# Patient Record
Sex: Male | Born: 1985 | Hispanic: No | Marital: Single | Smoking: Never smoker
Health system: Southern US, Community
[De-identification: ages and names within clinical notes are randomized; demographics above are authoritative.]

---

## 2015-12-24 ENCOUNTER — Emergency Department (HOSPITAL_COMMUNITY)
Admission: EM | Admit: 2015-12-24 | Discharge: 2015-12-24 | Disposition: A | Discharge status: Home / Self Care | Payer: Self-pay | Attending: Emergency Medicine | Admitting: Emergency Medicine

## 2015-12-24 ENCOUNTER — Encounter (HOSPITAL_COMMUNITY): Payer: Self-pay | Admitting: Emergency Medicine

## 2015-12-24 ENCOUNTER — Emergency Department (HOSPITAL_COMMUNITY): Payer: Self-pay

## 2015-12-24 DIAGNOSIS — R0789 Other chest pain: Secondary | ICD-10-CM | POA: Insufficient documentation

## 2015-12-24 LAB — COMPREHENSIVE METABOLIC PANEL
ALBUMIN: 4.2 g/dL (ref 3.5–5.0)
ALK PHOS: 69 U/L (ref 38–126)
ALT: 41 U/L (ref 17–63)
AST: 27 U/L (ref 15–41)
Anion gap: 8 (ref 5–15)
BUN: 7 mg/dL (ref 6–20)
CALCIUM: 9.3 mg/dL (ref 8.9–10.3)
CO2: 27 mmol/L (ref 22–32)
CREATININE: 0.88 mg/dL (ref 0.61–1.24)
Chloride: 101 mmol/L (ref 101–111)
GFR calc Af Amer: >60 mL/min (ref >60)
GFR calc non Af Amer: >60 mL/min (ref >60)
GLUCOSE: 118 mg/dL — AB (ref 65–99)
Potassium: 3.6 mmol/L (ref 3.5–5.1)
SODIUM: 136 mmol/L (ref 135–145)
TOTAL PROTEIN: 7 g/dL (ref 6.5–8.1)
Total Bilirubin: 0.4 mg/dL (ref 0.3–1.2)

## 2015-12-24 LAB — CBC WITH DIFFERENTIAL/PLATELET
Basophils Absolute: 0 10*3/uL (ref 0.0–0.1)
Basophils Relative: 0 %
EOS ABS: 0.1 10*3/uL (ref 0.0–0.7)
Eosinophils Relative: 1 %
HCT: 43.4 % (ref 39.0–52.0)
HEMOGLOBIN: 15.1 g/dL (ref 13.0–17.0)
LYMPHS ABS: 2.9 10*3/uL (ref 0.7–4.0)
Lymphocytes Relative: 33 %
MCH: 28.2 pg (ref 26.0–34.0)
MCHC: 34.8 g/dL (ref 30.0–36.0)
MCV: 81.1 fL (ref 78.0–100.0)
Monocytes Absolute: 0.4 10*3/uL (ref 0.1–1.0)
Monocytes Relative: 4 %
NEUTROS PCT: 62 %
Neutro Abs: 5.4 10*3/uL (ref 1.7–7.7)
Platelets: 234 10*3/uL (ref 150–400)
RBC: 5.35 MIL/uL (ref 4.22–5.81)
RDW: 12.3 % (ref 11.5–15.5)
WBC: 8.8 10*3/uL (ref 4.0–10.5)

## 2015-12-24 LAB — I-STAT TROPONIN, ED
TROPONIN I, POC: 0 ng/mL (ref 0.00–0.08)
TROPONIN I, POC: 0 ng/mL (ref 0.00–0.08)

## 2015-12-24 MED ORDER — NAPROXEN 250 MG PO TABS
500.0000 mg | ORAL_TABLET | Freq: Once | ORAL | Status: AC
  Administered 2015-12-24: 500 mg via ORAL
  Filled 2015-12-24: qty 2

## 2015-12-24 MED ORDER — METHOCARBAMOL 500 MG PO TABS
500.0000 mg | ORAL_TABLET | Freq: Once | ORAL | Status: AC
  Administered 2015-12-24: 500 mg via ORAL
  Filled 2015-12-24: qty 1

## 2015-12-24 MED ORDER — NAPROXEN 500 MG PO TABS: 500.0000 mg | ORAL_TABLET | Freq: Two times a day (BID) | ORAL | 0 refills | Status: AC

## 2015-12-24 MED ORDER — METHOCARBAMOL 500 MG PO TABS: 500.0000 mg | ORAL_TABLET | Freq: Two times a day (BID) | ORAL | 0 refills | Status: AC

## 2015-12-24 NOTE — Discharge Instructions (Signed)
We saw you in the ER for the chest pain/shortness of breath. ?All of our cardiac workup is normal, including labs, EKG and chest X-RAY are normal. ?We are not sure what is causing your discomfort, but we feel comfortable sending you home at this time. The workup in the ER is not complete, and you should follow up with your primary care doctor for further evaluation. ? ?Please return to the ER if you have worsening chest pain, shortness of breath, pain radiating to your jaw, shoulder, or back, sweats or fainting.  ?

## 2015-12-24 NOTE — ED Triage Notes (Signed)
Patient reports intermittent left chest pain onset 3 days ago with mild SOB and emesis yesterday .

## 2015-12-24 NOTE — ED Provider Notes (Addendum)
[facility]-EMERGENCY DEPT Provider Note   CSN: [id number] Arrival date & time: 12/24/15  [id number]  By signing my name below, I, [name], attest that this documentation has been prepared under the direction and in the presence of Derwood KaplanAnkit Marks Scalera, MD. Electronically Signed: [name], ED Scribe. 12/24/15. 3:11 AM.  History   Chief Complaint Chief Complaint  Patient presents with  . Chest Pain     The history is provided by the patient. No language interpreter was used.     HPI Comments: Robert Ponce is a 30 y.o. male who presents to the Emergency Department complaining of intermittent stabbing pains of the left lower chest which began 3 days ago. Pain worsens at night and when working; states he walks a lot at work. The pain comes hourly and lasts for a few seconds and resolves on its own. Pt associated symptoms include fatigue and occasional lightheadedness. Pt is a smoker and states he smoke 10 cigarettes a day. He is also an occasional drinker. No illicit drug use. No modifying factors noted. Pt denies SOB, nausea, vomiting, and cough. He also denies h/o diabetes, HLD, PE/DVT, long distance traveling and recent surgeries. Pt has a FHx of cardiac issues.     History reviewed. No pertinent past medical history.  There are no active problems to display for this patient.   History reviewed. No pertinent surgical history.     Home Medications    Prior to Admission medications   Medication Sig Start Date End Date Taking? Authorizing Provider  methocarbamol (ROBAXIN) 500 MG tablet Take 1 tablet (500 mg total) by mouth 2 (two) times daily. 12/24/15   Derwood KaplanAnkit Kenderick Kobler, MD  naproxen (NAPROSYN) 500 MG tablet Take 1 tablet (500 mg total) by mouth 2 (two) times daily. 12/24/15   Derwood KaplanAnkit Dinita Migliaccio, MD    Family History No family history on file.  Social History Social History  Substance Use Topics  . Smoking status: Never Smoker  . Smokeless tobacco: Never Used  . Alcohol use No      Allergies   Patient has no known allergies.   Review of Systems Review of Systems  10 systems reviewed and all are negative for acute change except as noted in the HPI.   Physical Exam Updated Vital Signs BP 104/74 (BP Location: Right Arm)   Pulse 74   Temp 98.1 F (36.7 C) (Oral)   Resp 22   Ht 5\' 9"  (1.753 m)   Wt 215 lb (97.5 kg)   SpO2 99%   BMI 31.75 kg/m   Physical Exam  Constitutional: He appears well-developed and well-nourished.  HENT:  Head: Normocephalic and atraumatic.  Eyes: Conjunctivae are normal.  Neck: Neck supple.  No radiation to jaw and shoulders  Cardiovascular: Normal rate and regular rhythm.   No murmur heard. Distal pulses are equal  Pulmonary/Chest: Effort normal and breath sounds normal. No respiratory distress. He exhibits no tenderness.  Lungs are clear to ascultation, No reproducible chest tenderness  Abdominal: Soft. There is no tenderness.  Abdomen is soft  Musculoskeletal: He exhibits no edema.  No pedal edema No unilateral leg swelling  Neurological: He is alert.  Skin: Skin is warm and dry.  Psychiatric: He has a normal mood and affect.  Nursing note and vitals reviewed.    ED Treatments / Results  DIAGNOSTIC STUDIES: Oxygen Saturation is 100% on RA, normal by my interpretation.    COORDINATION OF CARE: 2:25 AM Discussed treatment plan with pt at bedside and pt agreed  to plan.  Labs (all labs ordered are listed, but only abnormal results are displayed) Labs Reviewed  COMPREHENSIVE METABOLIC PANEL - Abnormal; Notable for the following:       Result Value   Glucose, Bld 118 (*)    All other components within normal limits  CBC WITH DIFFERENTIAL/PLATELET  Rosezena SensorI-STAT TROPOININ, ED  Rosezena SensorI-STAT TROPOININ, ED    EKG  EKG Interpretation  Date/Time:  Saturday December 24 2015 01:08:20 EST Ventricular Rate:  92 PR Interval:  154 QRS Duration: 92 QT Interval:  324 QTC Calculation: 400 R Axis:   58 Text Interpretation:   Normal sinus rhythm Normal ECG No acute changes No significant change since last tracing Confirmed by Rhunette CroftNANAVATI, MD, Janey GentaANKIT [phone]) on 12/24/2015 1:24:57 AM       Radiology Dg Chest 2 View  Result Date: 12/24/2015 CLINICAL DATA:  Left-sided chest pain and dyspnea for 3 days EXAM: CHEST  2 VIEW COMPARISON:  None. FINDINGS: The lungs are clear. The pulmonary vasculature is normal. Heart size is normal. Hilar and mediastinal contours are unremarkable. There is no pleural effusion. IMPRESSION: No active cardiopulmonary disease. Electronically Signed   By: Ellery Plunkaniel R Mitchell M.D.   On: 12/24/2015 01:35    Procedures Procedures (including critical care time)  Medications Ordered in ED Medications  naproxen (NAPROSYN) tablet 500 mg (500 mg Oral Given 12/24/15 0249)  methocarbamol (ROBAXIN) tablet 500 mg (500 mg Oral Given 12/24/15 0248)     Initial Impression / Assessment and Plan / ED Course  I have reviewed the triage vital signs and the nursing notes.  Pertinent labs & imaging results that were available during my care of the patient were reviewed by me and considered in my medical decision making (see chart for details).  Clinical Course as of Dec 24 502  Sat Dec 24, 2015  0451 Trops neg. We will d/c. PCP f/u advised.   [AN]    Clinical Course User Index [AN] Derwood KaplanAnkit Jessika Rothery, MD   Pt comes in with cc of chest pain. Differential diagnosis includes: ACS syndrome Myocarditis Pericarditis Musculoskeletal pain  Pt is a poor historian. Hx is not very clear as to what the etiology is, but on exam it appears more musculoskeletal. HEAR score is 1 - for smoking. EKg is reassuring. We will get trops x 2.   Final Clinical Impressions(s) / ED Diagnoses   Final diagnoses:  Atypical chest pain    New Prescriptions New Prescriptions   METHOCARBAMOL (ROBAXIN) 500 MG TABLET    Take 1 tablet (500 mg total) by mouth 2 (two) times daily.   NAPROXEN (NAPROSYN) 500 MG TABLET    Take 1  tablet (500 mg total) by mouth 2 (two) times daily.   I personally performed the services described in this documentation, which was scribed in my presence. The recorded information has been reviewed and is accurate.     Derwood KaplanAnkit Tijuana Scheidegger, MD 12/24/15 [id number]    Derwood KaplanAnkit Alaya Iverson, MD 12/24/15 [id number]

## 2017-12-16 IMAGING — CR DG CHEST 2V
2 series · 2 of 2 positions shown · non-contrast
Comparison: None.

CLINICAL DATA: Left-sided chest pain and dyspnea for 3 days

EXAM:
CHEST  2 VIEW

[chest pa]
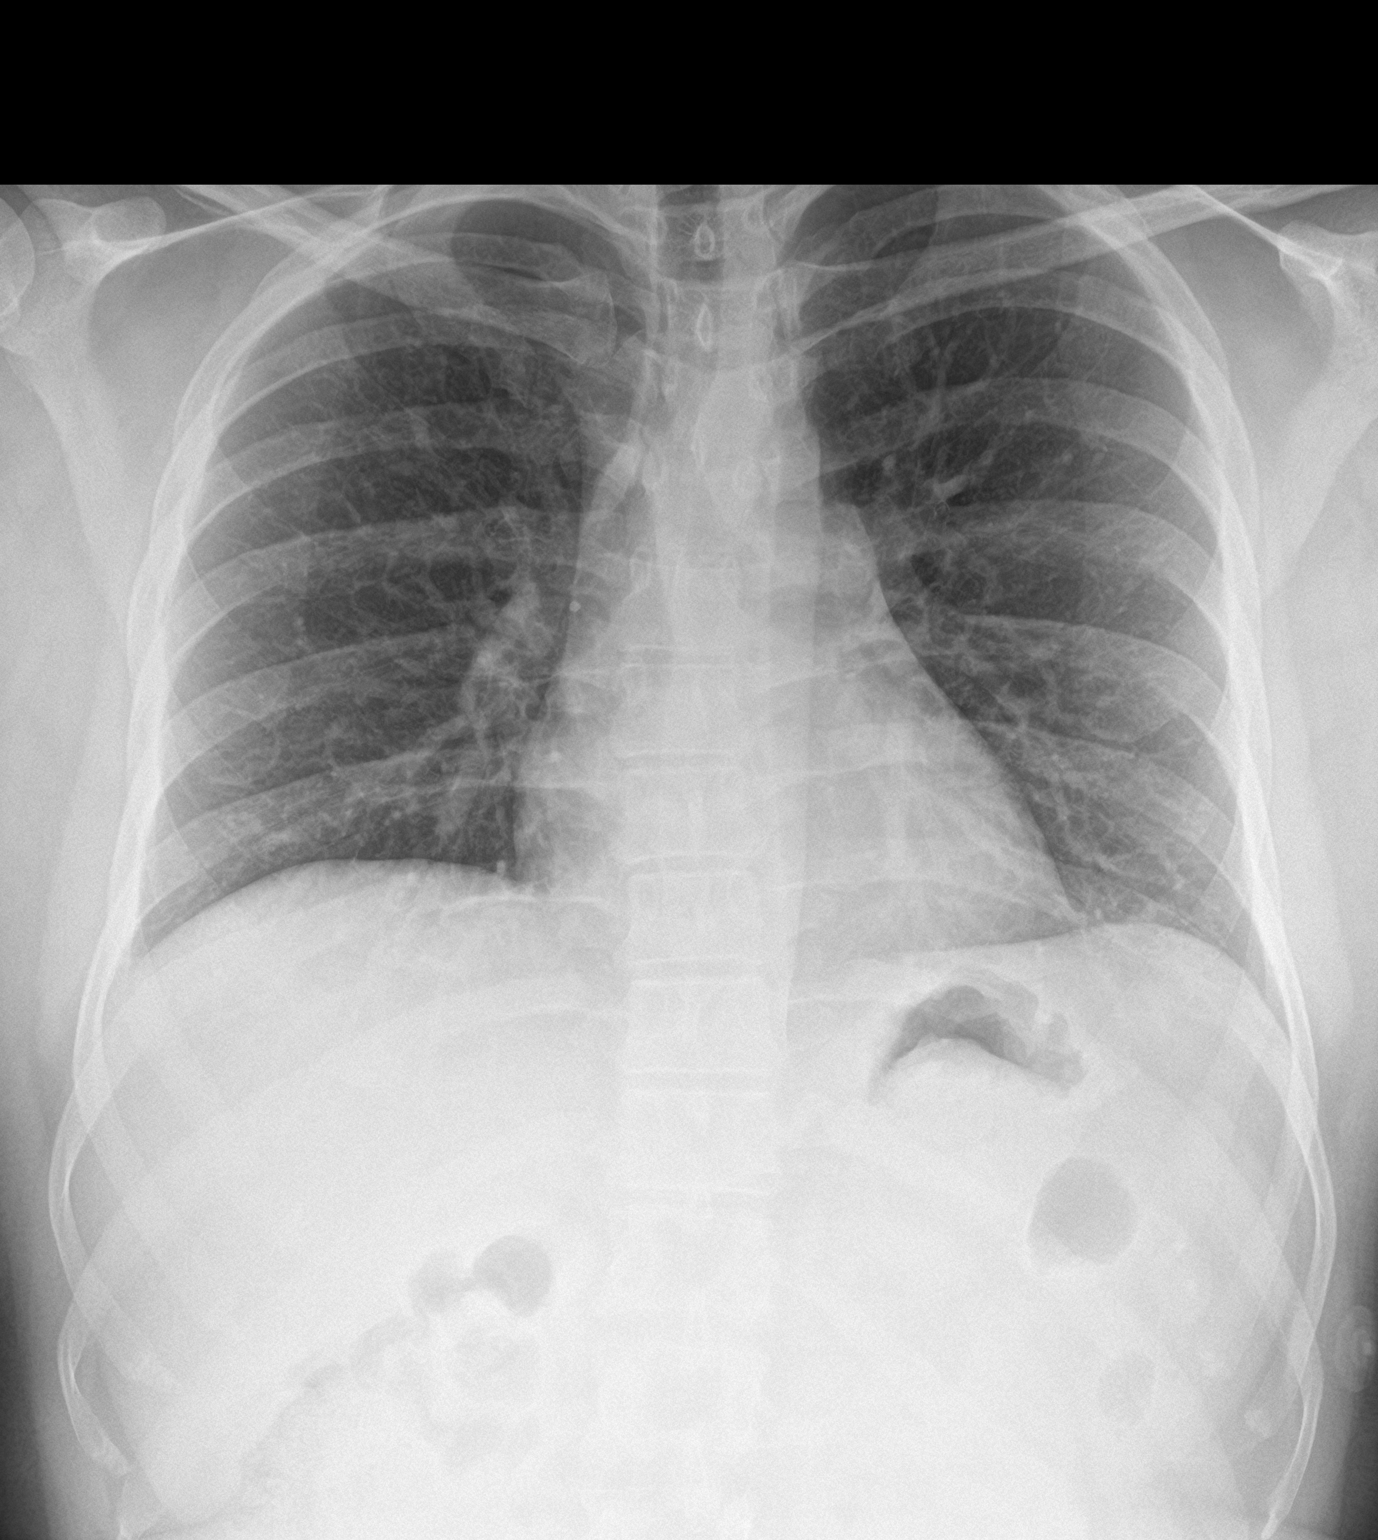

[chest lat]
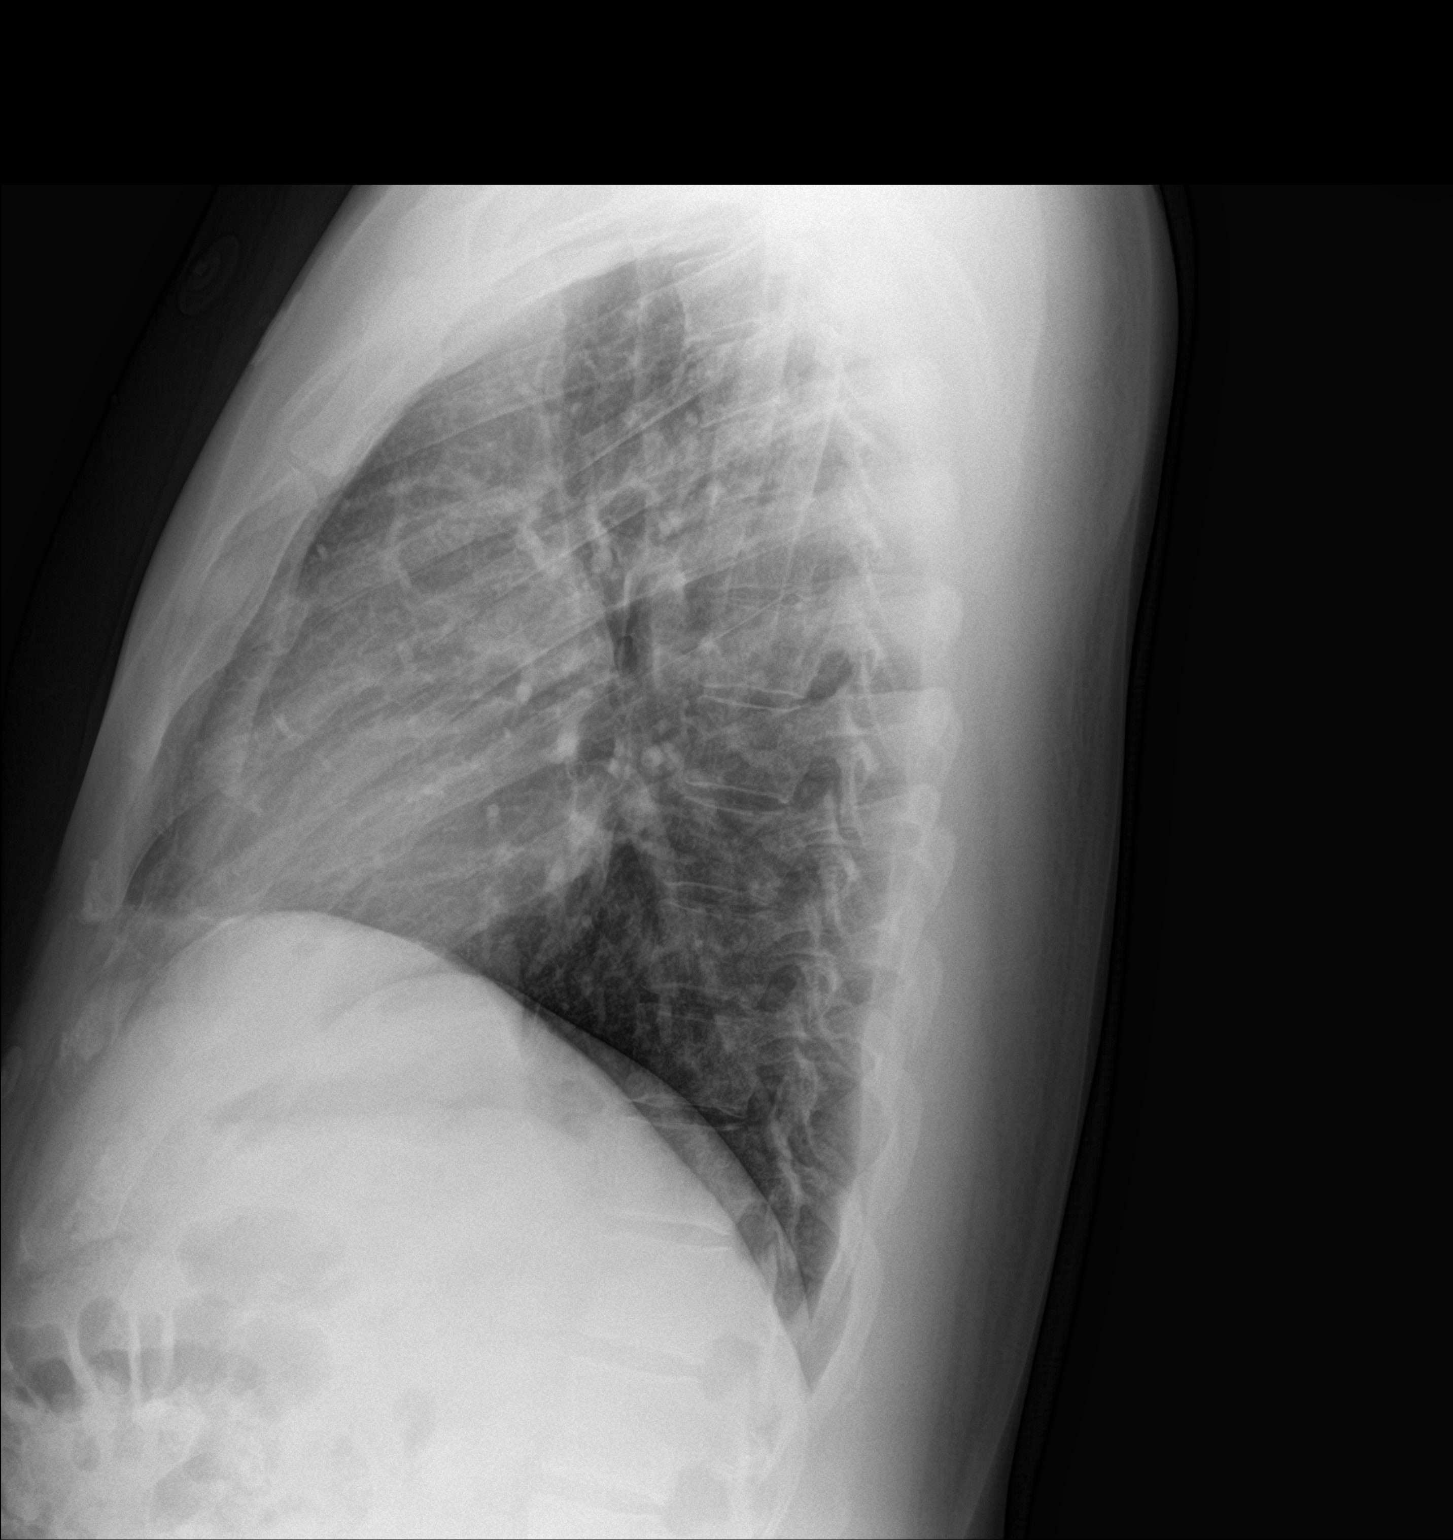

[2 of 2 positions shown; findings below may reference images not displayed]

FINDINGS: The lungs are clear. The pulmonary vasculature is normal. Heart size
is normal. Hilar and mediastinal contours are unremarkable. There is
no pleural effusion.
IMPRESSION: No active cardiopulmonary disease.
